# Patient Record
Sex: Female | Born: 1935 | Race: Black or African American | Marital: Married | Smoking: Former smoker
Health system: Southern US, Community
[De-identification: ages and names within clinical notes are randomized; demographics above are authoritative.]

---

## 2012-03-09 ENCOUNTER — Other Ambulatory Visit: Payer: Self-pay | Admitting: Orthopedic Surgery

## 2012-03-09 DIAGNOSIS — M545 Low back pain: Secondary | ICD-10-CM

## 2012-03-16 ENCOUNTER — Ambulatory Visit
Admission: RE | Admit: 2012-03-16 | Discharge: 2012-03-16 | Disposition: A | Discharge status: Home / Self Care | Payer: Medicare Other | Source: Ambulatory Visit | Attending: Orthopedic Surgery | Admitting: Orthopedic Surgery

## 2012-03-16 DIAGNOSIS — M545 Low back pain: Secondary | ICD-10-CM

## 2013-02-09 ENCOUNTER — Other Ambulatory Visit: Payer: Self-pay | Admitting: Orthopedic Surgery

## 2013-02-09 DIAGNOSIS — R2232 Localized swelling, mass and lump, left upper limb: Secondary | ICD-10-CM

## 2013-02-14 ENCOUNTER — Ambulatory Visit
Admission: RE | Admit: 2013-02-14 | Discharge: 2013-02-14 | Disposition: A | Discharge status: Home / Self Care | Payer: Medicare Other | Source: Ambulatory Visit | Attending: Orthopedic Surgery | Admitting: Orthopedic Surgery

## 2013-02-14 DIAGNOSIS — R2232 Localized swelling, mass and lump, left upper limb: Secondary | ICD-10-CM

## 2013-02-14 MED ORDER — GADOBENATE DIMEGLUMINE 529 MG/ML IV SOLN
20.0000 mL | Freq: Once | INTRAVENOUS | Status: AC | PRN
  Administered 2013-02-14: 20 mL via INTRAVENOUS

## 2016-03-24 ENCOUNTER — Other Ambulatory Visit: Payer: Self-pay | Admitting: Anesthesiology

## 2016-03-24 DIAGNOSIS — M48062 Spinal stenosis, lumbar region with neurogenic claudication: Secondary | ICD-10-CM

## 2016-04-11 ENCOUNTER — Ambulatory Visit
Admission: RE | Admit: 2016-04-11 | Discharge: 2016-04-11 | Disposition: A | Discharge status: Home / Self Care | Payer: Medicare Other | Source: Ambulatory Visit | Attending: Anesthesiology | Admitting: Anesthesiology

## 2016-04-11 DIAGNOSIS — M48062 Spinal stenosis, lumbar region with neurogenic claudication: Secondary | ICD-10-CM

## 2017-03-18 IMAGING — MR MR LUMBAR SPINE W/O CM
4 of 5 series · 25 of 48 positions shown · non-contrast
Comparison: 03/16/2012

CLINICAL DATA: Low back pain with bilateral leg pain

EXAM:
MRI LUMBAR SPINE WITHOUT CONTRAST
TECHNIQUE: Multiplanar, multisequence MR imaging of the lumbar spine was
performed. No intravenous contrast was administered.

[Series 3: T2 post-contrast · sagittal · 4.0mm · 0.55mm/px · 6 of 13 slices shown]
[im 1/13]
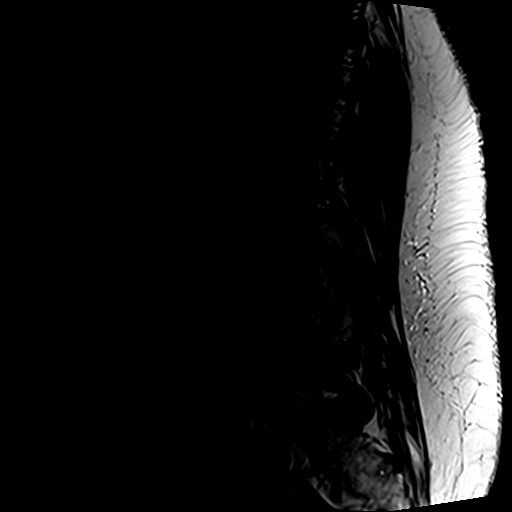
[im 3/13]
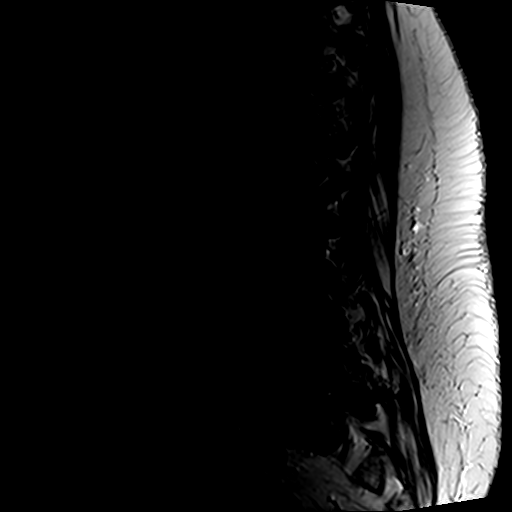
[im 5/13]
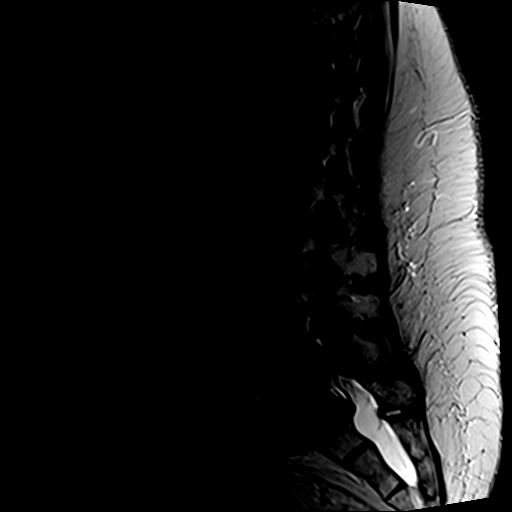
[im 8/13]
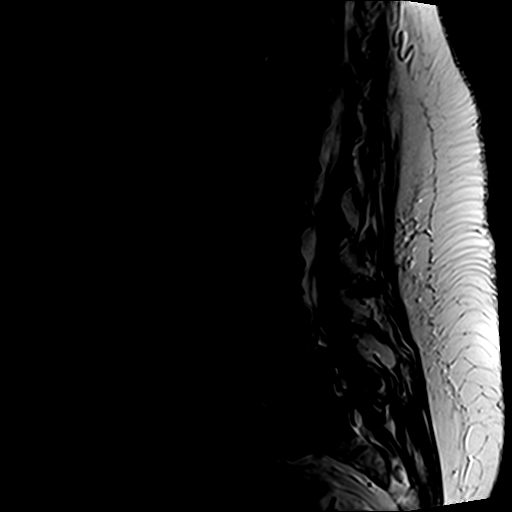
[im 10/13]
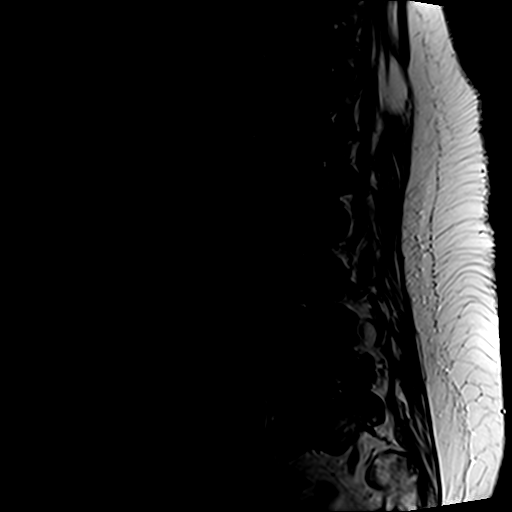
[im 13/13]
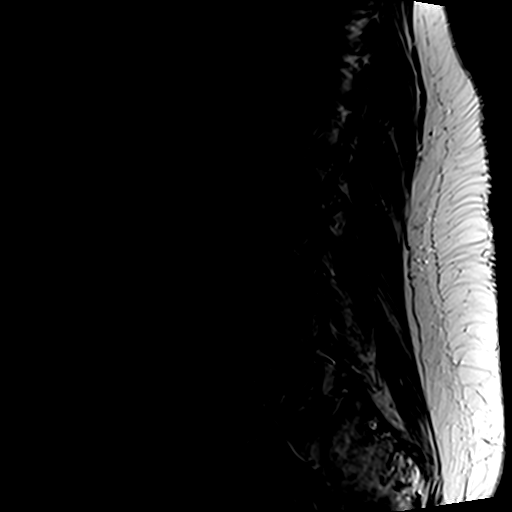

[Series 5: T1 · sagittal · 4.0mm · 0.55mm/px · 6 of 13 slices shown (1 of 2)]
[im 1/13]
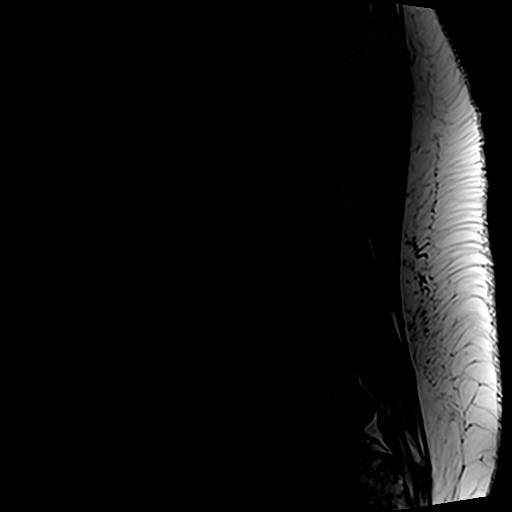
[im 3/13]
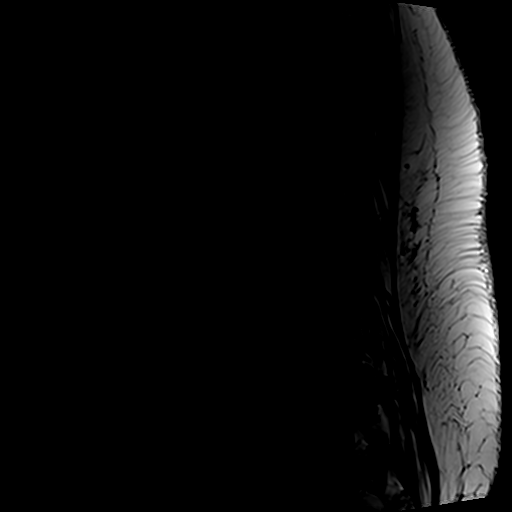
[im 5/13]
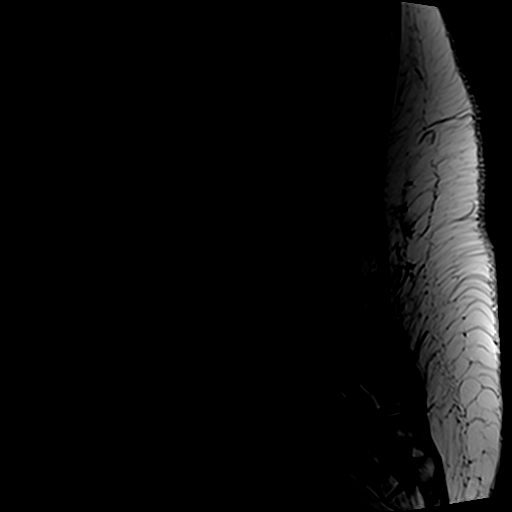
[im 8/13]
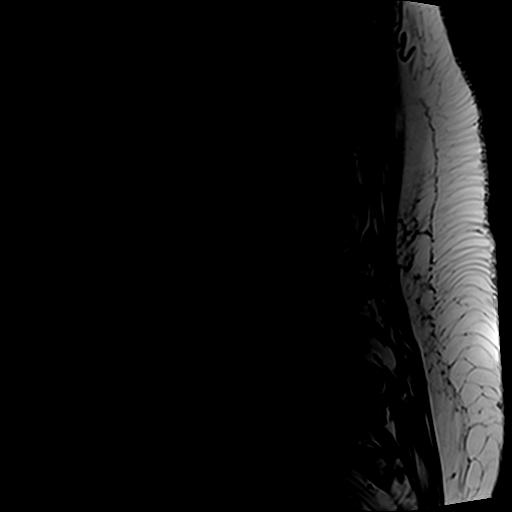
[im 10/13]
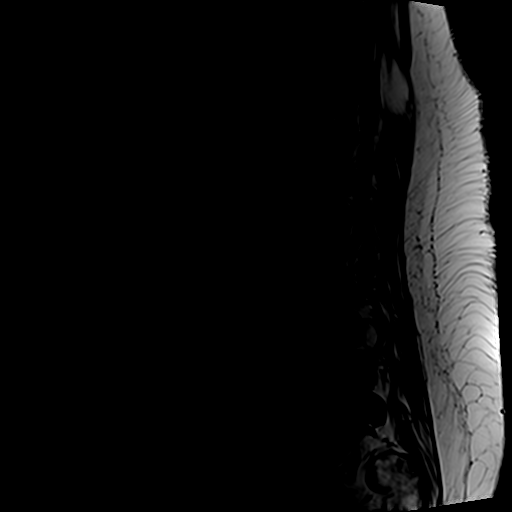
[im 13/13]
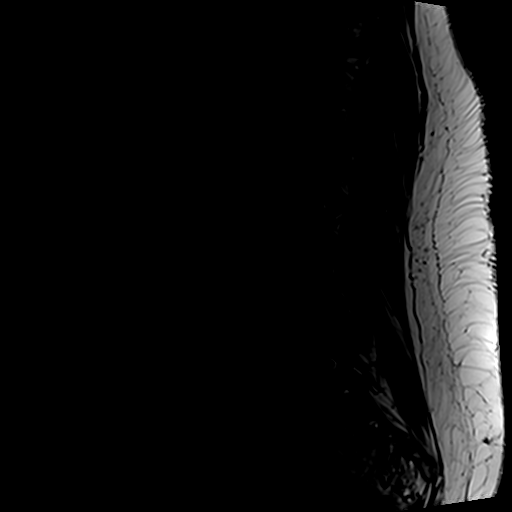

[Series 6: T1 · axial · 4.0mm · 0.35mm/px · z∈[+21,+175]mm · 4 of 34 slices shown (2 of 2)]
[im 1/34]
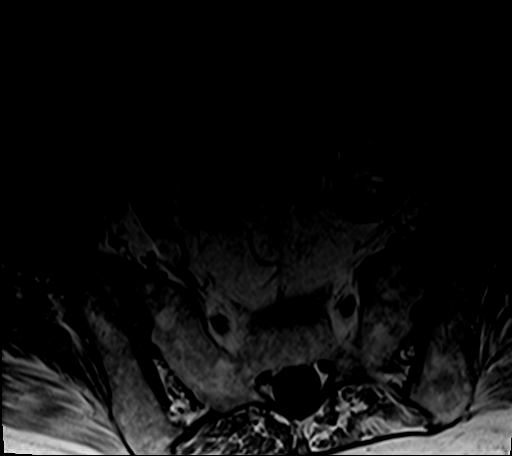
[im 5/34]
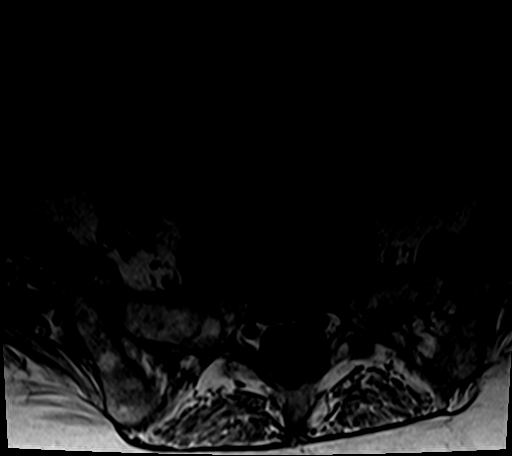
[im 17/34]
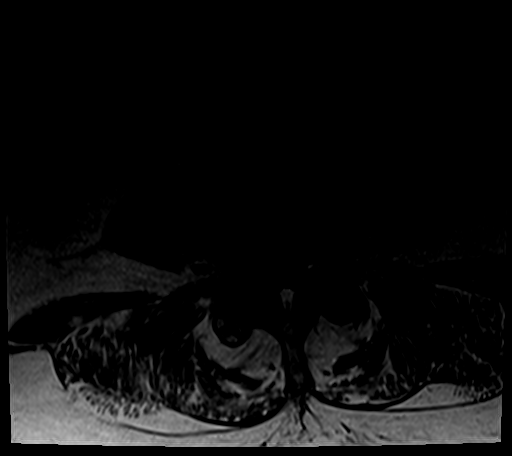
[im 29/34]
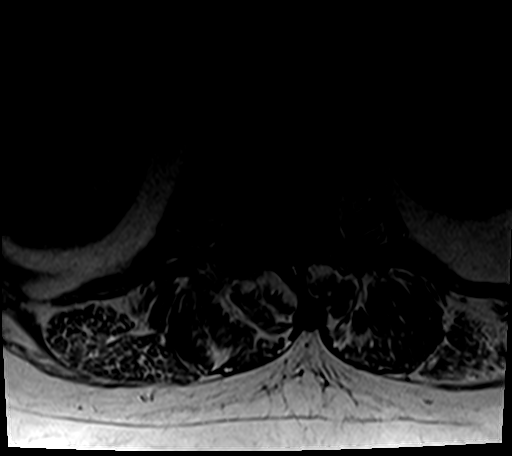

[Series 7: T2 · axial · 4.0mm · 0.70mm/px · z∈[+21,+201]mm · 9 of 34 slices shown]
[im 1/34]
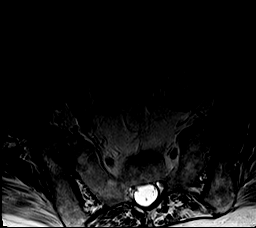
[im 5/34]
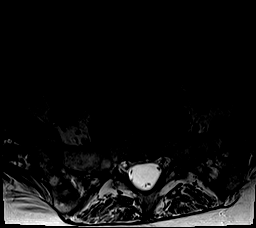
[im 10/34]
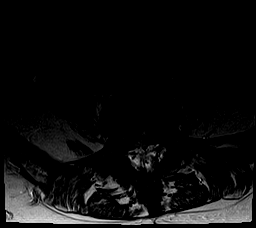
[im 15/34]
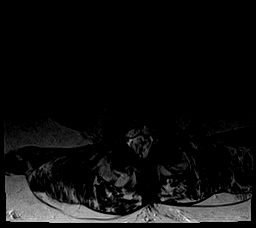
[im 17/34]
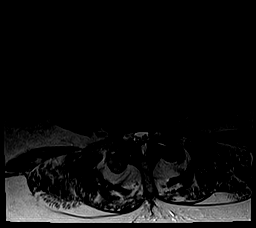
[im 19/34]
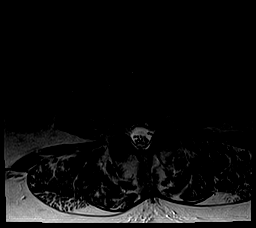
[im 24/34]
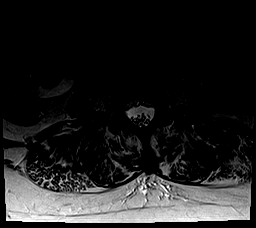
[im 29/34]
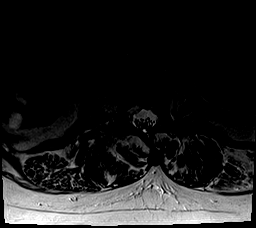
[im 34/34]
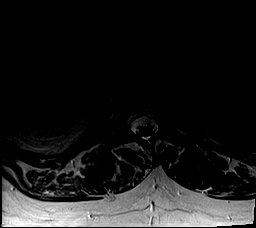

[25 of 48 positions shown; findings below may reference images not displayed]

FINDINGS: Segmentation:  Standard.

Alignment: 7 mm anterolisthesis of L4 on L5 secondary to facet
disease. 2 mm retrolisthesis of T12 on L1, L1 on L2 and L2 on L3.

Vertebrae:  No fracture, evidence of discitis, or bone lesion.

Conus medullaris: Extends to the L1 level and appears normal.

Paraspinal and other soft tissues: No paraspinal abnormality. Small
T2 hyperintense right renal mass most consistent with a small cyst.

Disc levels:

Disc spaces: Degenerative disc disease with disc height loss
throughout the lumbar spine and lower thoracic spine.

T9-10, T10-11 and T11-12: Mild broad-based disc bulge is on the
sagittal images with bilateral mild facet arthropathy in bilateral
mild foraminal narrowing. No central canal stenosis.

T12-L1: Mild broad-based disc bulge. Mild bilateral facet
arthropathy. No evidence of neural foraminal stenosis. No central
canal stenosis.

L1-L2: Mild broad-based disc bulge with a left paracentral disc
protrusion with caudal migration of disc material. Bilateral lateral
recess stenosis and mild bilateral facet arthropathy. Moderate
bilateral foraminal stenosis. No central canal stenosis.

L2-L3: Mild broad-based disc bulge. Mild bilateral facet
arthropathy. Mild bilateral foraminal stenosis. No central canal
stenosis.

L3-L4: Moderate broad-based disc bulge flattening the ventral thecal
sac. Moderate bilateral facet arthropathy. Mild spinal stenosis and
bilateral lateral recess stenosis. Moderate left and mild right
foraminal stenosis.

L4-L5: Moderate broad-based disc bulge. Severe bilateral facet
arthropathy ligamentum flavum infolding resulting in severe spinal
stenosis and severe bilateral foraminal stenosis.

L5-S1: Broad-based disc bulge. Mild bilateral facet arthropathy.
Bilateral lateral recess stenosis. Moderate bilateral foraminal
stenosis. No central canal stenosis.
IMPRESSION: 1. At L4-5 there is a moderate broad-based disc bulge. Severe
bilateral facet arthropathy ligamentum flavum infolding resulting in
severe spinal stenosis and severe bilateral foraminal stenosis.
Grade 1 anterolisthesis of L4 on L5 secondary to facet arthropathy.
2. At L1-2 there is a mild broad-based disc bulge with a left
paracentral disc protrusion with caudal migration of disc material.
Bilateral lateral recess stenosis and mild bilateral facet
arthropathy. Moderate bilateral foraminal stenosis.
3. At L3-4 there is a moderate broad-based disc bulge flattening the
ventral thecal sac. Moderate bilateral facet arthropathy. Mild
spinal stenosis and bilateral lateral recess stenosis. Moderate left
and mild right foraminal stenosis.

## 2020-10-14 ENCOUNTER — Other Ambulatory Visit: Payer: Self-pay

## 2020-10-14 ENCOUNTER — Encounter: Payer: Self-pay | Admitting: Vascular Surgery

## 2020-10-14 ENCOUNTER — Ambulatory Visit (INDEPENDENT_AMBULATORY_CARE_PROVIDER_SITE_OTHER): Payer: Medicare Other | Admitting: Vascular Surgery

## 2020-10-14 VITALS — BP 115/77 | HR 81 | Temp 97.4°F | Resp 16 | Ht 65.0 in | Wt 187.0 lb

## 2020-10-14 DIAGNOSIS — I6523 Occlusion and stenosis of bilateral carotid arteries: Secondary | ICD-10-CM

## 2020-10-14 NOTE — Progress Notes (Signed)
[facility] [place]  Patient name: Desiree Horn MRN: [id number] DOB: 01-Apr-1933 Sex: female  REASON FOR CONSULT: Discuss finding of asymptomatic bilateral carotid stenosis  HPI: Desiree Horn is a 87 y.o. female, who is here today for discussion of carotid duplex suggesting bilateral carotid disease.  She is here today with her granddaughter.  She was admitted to [facility] and went evaluation for severe anemia.  This was found to be related to H pylori infection and ulcer disease.  During her hospitalization she underwent carotid duplex related to carotid bruit.  This revealed 70% right carotid stenosis and 50 to 69% left carotid stenosis.  She had vertebral antegrade flow bilaterally.  She specifically denies any prior history of amaurosis fugax, aphasia, TIA or stroke.  History reviewed. No pertinent past medical history.  History reviewed. No pertinent family history.  SOCIAL HISTORY: Social History   Socioeconomic History  . Marital status: Married    Spouse name: Not on file  . Number of children: Not on file  . Years of education: Not on file  . Highest education level: Not on file  Occupational History  . Not on file  Tobacco Use  . Smoking status: Former Games developer  . Smokeless tobacco: Never Used  Substance and Sexual Activity  . Alcohol use: Not Currently  . Drug use: Not on file  . Sexual activity: Not on file  Other Topics Concern  . Not on file  Social History Narrative  . Not on file   Social Determinants of Health   Financial Resource Strain: Not on file  Food Insecurity: Not on file  Transportation Needs: Not on file  Physical Activity: Not on file  Stress: Not on file  Social Connections: Not on file  Intimate Partner Violence: Not on file    Allergies  Allergen Reactions  . Sulfa Antibiotics Rash    Pt unsure of reaction     Current Outpatient Medications  Medication Sig Dispense  Refill  . amLODipine (NORVASC) 10 MG tablet amlodipine 10 mg tablet    . ascorbic acid (VITAMIN C) 500 MG tablet Vitamin C 500 mg tablet    . Cholecalciferol 50 MCG (2000 UT) CAPS Take 1 tablet by mouth daily.    . ferrous sulfate 325 (65 FE) MG tablet Take 1 tablet by mouth daily.    Marland Kitchen gabapentin (NEURONTIN) 300 MG capsule Take 1 capsule by mouth 2 (two) times daily.    Marland Kitchen glipiZIDE (GLUCOTROL XL) 5 MG 24 hr tablet glipizide ER 5 mg tablet, extended release 24 hr    . hydroxyurea (HYDREA) 500 MG capsule hydroxyurea 500 mg capsule    . latanoprost (XALATAN) 0.005 % ophthalmic solution Apply to eye.    . losartan (COZAAR) 25 MG tablet Take 1 tablet by mouth daily.    . metoprolol succinate (TOPROL-XL) 100 MG 24 hr tablet metoprolol succinate ER 100 mg tablet,extended release 24 hr    . pantoprazole (PROTONIX) 40 MG tablet TAKE 1 TABLET (40 MG TOTAL) BY MOUTH TWO (2) TIMES A DAY FOR 14 DAYS.    Marland Kitchen probenecid (BENEMID) 500 MG tablet probenecid 500 mg tablet  TK 1 T PO QD    . rivaroxaban (XARELTO) 20 MG TABS tablet Xarelto 20 mg tablet    . simvastatin (ZOCOR) 20 MG tablet Take 1 tablet by mouth daily.    . sucralfate (CARAFATE) 1 g tablet sucralfate 1 gram tablet    . oxyCODONE (OXY IR/ROXICODONE) 5 MG immediate  release tablet Take 5 mg by mouth in the morning, at noon, and at bedtime.     No current facility-administered medications for this visit.    REVIEW OF SYSTEMS:  [X]  denotes positive finding, [ ]  denotes negative finding Cardiac  Comments:  Chest pain or chest pressure:    Shortness of breath upon exertion:    Short of breath when lying flat:    Irregular heart rhythm:        Vascular    Pain in calf, thigh, or hip brought on by ambulation:    Pain in feet at night that wakes you up from your sleep:     Blood clot in your veins:    Leg swelling:         Pulmonary    Oxygen at home:    Productive cough:     Wheezing:         Neurologic    Sudden weakness in arms or legs:      Sudden numbness in arms or legs:     Sudden onset of difficulty speaking or slurred speech:    Temporary loss of vision in one eye:     Problems with dizziness:         Gastrointestinal    Blood in stool:     Vomited blood:         Genitourinary    Burning when urinating:     Blood in urine:        Psychiatric    Major depression:         Hematologic    Bleeding problems:    Problems with blood clotting too easily:        Skin    Rashes or ulcers:        Constitutional    Fever or chills:      PHYSICAL EXAM: Vitals:   10/14/20 1125  BP: 115/77  Pulse: 81  Resp: 16  Temp: (!) 97.4 F (36.3 C)  TempSrc: Other (Comment)  SpO2: 98%  Weight: 187 lb (84.8 kg)  Height: 5\' 5"  (1.651 m)    GENERAL: The patient is a well-nourished female, in no acute distress. The vital signs are documented above. CARDIOVASCULAR: I do not appreciate carotid bruit today.  She has 2+ radial pulses bilaterally PULMONARY: There is good air exchange  MUSCULOSKELETAL: There are no major deformities or cyanosis. NEUROLOGIC: No focal weakness or paresthesias are detected. SKIN: There are no ulcers or rashes noted. PSYCHIATRIC: The patient has a normal affect.  DATA:  Duplex from 10/09/2020 was reviewed showing above findings of 70% right and 50 to 69% left carotid stenosis  MEDICAL ISSUES: I had a long discussion with the patient and her granddaughter present.  Explained that she is asymptomatic.  She is right-handed.  I described symptoms of stroke with her.  I explained that she needs to dial 911 and present immediately to the hospital should she have any strokelike symptoms.  As long as she remains asymptomatic, would recommend serial duplex follow-up to rule out asymptomatic progression.  We will see her again in 6 months with repeat carotid duplex.  I did discuss the surgical treatment of carotid disease with her but feel that it is unlikely that she will require this.  We will see her  again in 6 months   12/14/20, MD [facility] [phone] Pager [phone]  Note: Portions of this report may have  been transcribed using voice recognition software.  Every effort has been made to ensure accuracy; however, inadvertent computerized transcription errors may still be present.

## 2020-10-16 ENCOUNTER — Other Ambulatory Visit: Payer: Self-pay

## 2020-10-16 DIAGNOSIS — I6523 Occlusion and stenosis of bilateral carotid arteries: Secondary | ICD-10-CM
